# Patient Record
Sex: Male | Born: 1970 | Race: White | Hispanic: No | Marital: Married | Smoking: Never smoker
Health system: Southern US, Community
[De-identification: ages and names within clinical notes are randomized; demographics above are authoritative.]

---

## 2010-08-31 ENCOUNTER — Emergency Department (INDEPENDENT_AMBULATORY_CARE_PROVIDER_SITE_OTHER): Payer: BC Managed Care – PPO

## 2010-08-31 ENCOUNTER — Emergency Department (HOSPITAL_BASED_OUTPATIENT_CLINIC_OR_DEPARTMENT_OTHER)
Admission: EM | Admit: 2010-08-31 | Discharge: 2010-08-31 | Disposition: A | Discharge status: Home / Self Care | Payer: BC Managed Care – PPO | Attending: Emergency Medicine | Admitting: Emergency Medicine

## 2010-08-31 DIAGNOSIS — K7689 Other specified diseases of liver: Secondary | ICD-10-CM

## 2010-08-31 DIAGNOSIS — M5126 Other intervertebral disc displacement, lumbar region: Secondary | ICD-10-CM

## 2010-08-31 DIAGNOSIS — M549 Dorsalgia, unspecified: Secondary | ICD-10-CM | POA: Insufficient documentation

## 2010-08-31 DIAGNOSIS — N201 Calculus of ureter: Secondary | ICD-10-CM

## 2010-08-31 DIAGNOSIS — N200 Calculus of kidney: Secondary | ICD-10-CM | POA: Insufficient documentation

## 2010-08-31 LAB — BASIC METABOLIC PANEL
Calcium: 9.3 mg/dL (ref 8.4–10.5)
Creatinine, Ser: 0.9 mg/dL (ref 0.4–1.5)
GFR calc Af Amer: >60 mL/min (ref >60)
GFR calc non Af Amer: >60 mL/min (ref >60)
Sodium: 146 mEq/L — ABNORMAL HIGH (ref 135–145)

## 2010-08-31 LAB — CBC
HCT: 41.6 % (ref 39.0–52.0)
Hemoglobin: 15.1 g/dL (ref 13.0–17.0)
MCV: 82.2 fL (ref 78.0–100.0)
RBC: 5.06 MIL/uL (ref 4.22–5.81)
WBC: 6.9 10*3/uL (ref 4.0–10.5)

## 2010-08-31 LAB — URINALYSIS, ROUTINE W REFLEX MICROSCOPIC
Specific Gravity, Urine: 1.034 — ABNORMAL HIGH (ref 1.005–1.030)
Urine Glucose, Fasting: NEGATIVE mg/dL
pH: 5.5 (ref 5.0–8.0)

## 2010-08-31 LAB — DIFFERENTIAL
Eosinophils Relative: 2 % (ref 0–5)
Lymphocytes Relative: 28 % (ref 12–46)
Lymphs Abs: 1.9 10*3/uL (ref 0.7–4.0)
Neutro Abs: 4.5 10*3/uL (ref 1.7–7.7)
Neutrophils Relative %: 64 % (ref 43–77)

## 2010-08-31 LAB — URINE MICROSCOPIC-ADD ON

## 2011-05-03 ENCOUNTER — Other Ambulatory Visit (HOSPITAL_COMMUNITY): Payer: Self-pay | Admitting: Neurosurgery

## 2011-05-03 DIAGNOSIS — IMO0002 Reserved for concepts with insufficient information to code with codable children: Secondary | ICD-10-CM

## 2011-05-03 DIAGNOSIS — Q762 Congenital spondylolisthesis: Secondary | ICD-10-CM

## 2011-05-10 ENCOUNTER — Ambulatory Visit (HOSPITAL_COMMUNITY)
Admission: RE | Admit: 2011-05-10 | Discharge: 2011-05-10 | Disposition: A | Discharge status: Home / Self Care | Payer: BC Managed Care – PPO | Source: Ambulatory Visit | Attending: Neurosurgery | Admitting: Neurosurgery

## 2011-05-10 DIAGNOSIS — M5126 Other intervertebral disc displacement, lumbar region: Secondary | ICD-10-CM | POA: Insufficient documentation

## 2011-05-10 DIAGNOSIS — Q762 Congenital spondylolisthesis: Secondary | ICD-10-CM

## 2011-05-10 DIAGNOSIS — IMO0002 Reserved for concepts with insufficient information to code with codable children: Secondary | ICD-10-CM

## 2012-10-30 ENCOUNTER — Emergency Department (HOSPITAL_BASED_OUTPATIENT_CLINIC_OR_DEPARTMENT_OTHER): Payer: BC Managed Care – PPO

## 2012-10-30 ENCOUNTER — Observation Stay (HOSPITAL_BASED_OUTPATIENT_CLINIC_OR_DEPARTMENT_OTHER)
Admission: EM | Admit: 2012-10-30 | Discharge: 2012-10-31 | Disposition: A | Discharge status: Home / Self Care | Payer: BC Managed Care – PPO | Attending: Cardiovascular Disease | Admitting: Cardiovascular Disease

## 2012-10-30 ENCOUNTER — Encounter (HOSPITAL_BASED_OUTPATIENT_CLINIC_OR_DEPARTMENT_OTHER): Payer: Self-pay | Admitting: Emergency Medicine

## 2012-10-30 DIAGNOSIS — E669 Obesity, unspecified: Secondary | ICD-10-CM | POA: Insufficient documentation

## 2012-10-30 DIAGNOSIS — M940 Chondrocostal junction syndrome [Tietze]: Secondary | ICD-10-CM | POA: Insufficient documentation

## 2012-10-30 DIAGNOSIS — R079 Chest pain, unspecified: Principal | ICD-10-CM | POA: Insufficient documentation

## 2012-10-30 DIAGNOSIS — Z6835 Body mass index (BMI) 35.0-35.9, adult: Secondary | ICD-10-CM | POA: Insufficient documentation

## 2012-10-30 DIAGNOSIS — R072 Precordial pain: Secondary | ICD-10-CM

## 2012-10-30 LAB — BASIC METABOLIC PANEL
CO2: 27 mEq/L (ref 19–32)
Calcium: 9.5 mg/dL (ref 8.4–10.5)
Chloride: 104 mEq/L (ref 96–112)
Creatinine, Ser: 0.9 mg/dL (ref 0.50–1.35)
GFR calc Af Amer: >90 mL/min (ref >90)
Sodium: 141 mEq/L (ref 135–145)

## 2012-10-30 LAB — TROPONIN I
Troponin I: <0.3 ng/mL (ref <0.30)
Troponin I: <0.3 ng/mL (ref <0.30)

## 2012-10-30 LAB — MAGNESIUM: Magnesium: 2 mg/dL (ref 1.5–2.5)

## 2012-10-30 LAB — CBC
Hemoglobin: 15.1 g/dL (ref 13.0–17.0)
MCH: 29.9 pg (ref 26.0–34.0)
MCHC: 34.8 g/dL (ref 30.0–36.0)
Platelets: 256 10*3/uL (ref 150–400)
RBC: 5.05 MIL/uL (ref 4.22–5.81)

## 2012-10-30 MED ORDER — ZOLPIDEM TARTRATE 5 MG PO TABS: 5.0000 mg | ORAL_TABLET | Freq: Every evening | ORAL | Status: DC | PRN

## 2012-10-30 MED ORDER — SODIUM CHLORIDE 0.9 % IJ SOLN: 3.0000 mL | Freq: Two times a day (BID) | INTRAMUSCULAR | Status: DC

## 2012-10-30 MED ORDER — SODIUM CHLORIDE 0.9 % IV SOLN
INTRAVENOUS | Status: DC
  Administered 2012-10-30: 14:00:00 via INTRAVENOUS

## 2012-10-30 MED ORDER — GI COCKTAIL ~~LOC~~
30.0000 mL | Freq: Once | ORAL | Status: AC
  Administered 2012-10-30: 30 mL via ORAL
  Filled 2012-10-30: qty 30

## 2012-10-30 MED ORDER — ONDANSETRON HCL 4 MG/2ML IJ SOLN: 4.0000 mg | Freq: Four times a day (QID) | INTRAMUSCULAR | Status: DC | PRN

## 2012-10-30 MED ORDER — ASPIRIN 81 MG PO CHEW
324.0000 mg | CHEWABLE_TABLET | Freq: Once | ORAL | Status: AC
  Administered 2012-10-30: 324 mg via ORAL
  Filled 2012-10-30: qty 4

## 2012-10-30 MED ORDER — SODIUM CHLORIDE 0.9 % IJ SOLN: 3.0000 mL | INTRAMUSCULAR | Status: DC | PRN

## 2012-10-30 MED ORDER — ALPRAZOLAM 0.25 MG PO TABS: 0.2500 mg | ORAL_TABLET | Freq: Two times a day (BID) | ORAL | Status: DC | PRN

## 2012-10-30 MED ORDER — ASPIRIN EC 81 MG PO TBEC
81.0000 mg | DELAYED_RELEASE_TABLET | Freq: Every day | ORAL | Status: DC
  Administered 2012-10-31: 81 mg via ORAL
  Filled 2012-10-30: qty 1

## 2012-10-30 MED ORDER — ACETAMINOPHEN 325 MG PO TABS: 650.0000 mg | ORAL_TABLET | ORAL | Status: DC | PRN

## 2012-10-30 MED ORDER — SODIUM CHLORIDE 0.9 % IV SOLN: 250.0000 mL | INTRAVENOUS | Status: DC | PRN

## 2012-10-30 MED ORDER — NITROGLYCERIN 0.4 MG SL SUBL
0.4000 mg | SUBLINGUAL_TABLET | SUBLINGUAL | Status: DC | PRN
  Filled 2012-10-30: qty 25

## 2012-10-30 NOTE — H&P (Signed)
Cardiology H&P   Patient ID: Charles Liu MRN: [id number], DOB/AGE: [birth year]   Admit date: 10/30/2012 Date of Consult: 10/30/2012  Primary Physician: No primary provider on file. Primary Cardiologist: New  Reason for consult: chest pain  HPI: Charles Liu is a 42 y.o. male with no prior cardiac or medical history. He follows up with a PCP annually. No BP, cholesterol or thyroid issues.   He reports being in his USOH until 6:00 AM this morning when he experiencing left-sided dull chest pressure radiating to his left axilla without associated symptoms rated at a 1-2/10. No aggravation with position changes, deep inspiration or cough. No relation to meals. 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 No fevers, chills, n/v/d, unilateral leg pain, swelling, extended travel, trauma or strenuous activity. The pain persisted and he became concerned. He presented to [facility] ED for further evaluation.   There, EKG revealed NSR without ischemic changes. Trop-I WNL x 2. BMET, CBC unremarkable. VSS. CXR without acute cardiopulmonary process. He was transferred to [facility] for overnight observation and stress testing tomorrow AM.  He is currently stable with minimal chest discomfort.    Problem List: History reviewed. No pertinent past medical history.  History reviewed. No pertinent past surgical history.   Allergies: No Known Allergies  Home Medications: Prior to Admission medications   Not on File    Inpatient Medications:  . sodium chloride   Intravenous STAT   No prescriptions prior to admission    Family History  Problem Relation Age of Onset  . Atrial fibrillation Mother   . Diabetes Father   . Melanoma Father      History   Social History  . Marital Status: Married    Spouse Name: N/A    Number of Children: N/A  . Years of Education: N/A   Occupational History  . Not on file.   Social History Main Topics  . Smoking status:  Never Smoker   . Smokeless tobacco: Not on file  . Alcohol Use: No  . Drug Use: No  . Sexually Active: Not on file   Other Topics Concern  . Not on file   Social History Narrative  . No narrative on file    Review of Systems: General: negative for chills, fever, night sweats or weight changes.  Cardiovascular: positive for chest pain, negative for dyspnea on exertion, edema, orthopnea, palpitations, paroxysmal nocturnal dyspnea or shortness of breath Dermatological: negative for rash Respiratory: negative for cough or wheezing Urologic: negative for hematuria Abdominal: negative for nausea, vomiting, diarrhea, bright red blood per rectum, melena, or hematemesis Neurologic: negative for visual changes, syncope, or dizziness All other systems reviewed and are otherwise negative except as noted above.  Physical Exam: Blood pressure 120/71, pulse 64, temperature 98.2 F (36.8 C), temperature source Oral, resp. rate 18, height 5\' 8"  (1.727 m), weight 104.327 kg (230 lb), SpO2 98.00%.    General: Well developed, well nourished, in no acute distress. Head: Normocephalic, atraumatic, sclera non-icteric, no xanthomas, nares are without discharge.  Neck:  Negative for carotid bruits. JVD not elevated. Lungs: Clear bilaterally to auscultation without wheezes, rales, or rhonchi. Breathing is unlabored. Heart: RRR with S1 S2. No murmurs, rubs, or gallops appreciated. Abdomen: Soft, non-tender, non-distended with normoactive bowel sounds. No hepatomegaly. No rebound/guarding. No obvious abdominal masses. Msk:  Reproducible left-sided chest wall tenderness on palpation. Strength and tone appears normal for age. Extremities:  No clubbing, cyanosis or edema.  Distal pedal pulses are 2+ and equal bilaterally. Neuro: Alert and oriented X 3. Moves all extremities spontaneously. Psych:  Responds to questions appropriately with a normal affect.  Labs: Recent Labs     10/30/12  1000  WBC  5.9  HGB   15.1  HCT  43.4  MCV  85.9  PLT  256    Recent Labs Lab 10/30/12 1000  NA 141  K 4.1  CL 104  CO2 27  BUN 11  CREATININE 0.90  CALCIUM 9.5  GLUCOSE 98   Recent Labs     10/30/12  1000  10/30/12  1230  TROPONINI  <0.30  <0.30    Radiology/Studies: Dg Chest 2 View  10/30/2012  *RADIOLOGY REPORT*  Clinical Data: Left chest pain  CHEST - 2 VIEW  Comparison: None.  Findings: Lungs are clear. No pleural effusion or pneumothorax.  Cardiomediastinal silhouette is within normal limits.  Visualized osseous structures are within normal limits.  IMPRESSION: No evidence of acute cardiopulmonary disease.   Original Report Authenticated By: Charline Bills, M.D.    EKG: NSR, 67 bpm, no ST/T changes  ASSESSMENT AND PLAN:   42 y.o. male with no prior cardiac or medical history. He follows up with a PCP annually. No BP, cholesterol or thyroid issues.   1. Precordial pain  The patient has been transfered from [facility] ED for overnight rule out. Taking subjective and objective information into account, suspect chest discomfort represents more of a chest wall/inflammatory musculoskeletal etiology. He has no known cardiac risk factors, and at present would be considered at a low pretest probability. Discontinue IVF (continued on transfer). Obtain additional trop-I. Will plan ETT tomorrow. Make NPO at midnight. Will risk stratify- Hgb A1C, lipid panel, TSH in the meantime.   Signed, R. Hurman Horn, PA-C 10/30/2012, 5:03 PM  Patient seen, examined. Available data reviewed. Agree with findings, assessment, and plan as outlined by Hurman Horn, PA-C. The patient was independently interviewed and examined. He is an age-appropriate 42 year old male. He's in no acute distress. Lungs are clear. Heart is regular rate and rhythm. There is tenderness over the left chest wall to palpation. There is no edema. EKG shows normal sinus rhythm and is within normal limits. Initial lab test is unrevealing including a  normal troponin. I agree that an exercise treadmill test is appropriate to rule out ischemic heart disease. Overall he appears low risk and further stratification with a hemoglobin A1c, lipid panel are appropriate. He is a good candidate for a non-imaging treadmill study because of his young age and normal EKG. If his treadmill study is negative, I would suspect costochondritis and recommend a short course of nonsteroidal anti-inflammatory drugs.  Tonny Bollman, M.D. 10/30/2012 5:26 PM

## 2012-10-30 NOTE — ED Notes (Signed)
Gradual onset of left sided CP after awakening around 0630 this am.  Pain radiates under left axilla and to his back.  Also reports some "mild tingling in left hand" and h/a.  Denies SHOB, n/v or diaphoresis.

## 2012-10-30 NOTE — ED Notes (Signed)
Patient transported to X-ray 

## 2012-10-30 NOTE — Consult Note (Deleted)
Cardiology Consult Note   Patient ID: Charles Liu MRN: [id number], DOB/AGE: 1970-12-13   Admit date: 10/30/2012 Date of Consult: 10/30/2012  Primary Physician: No primary provider on file. Primary Cardiologist: New  Reason for consult: chest pain  HPI: Charles Liu is a 42 y.o. male with no prior cardiac or medical history. He follows up with a PCP annually. No BP, cholesterol or thyroid issues.   He reports being in his USOH until 6:00 AM this morning when he experiencing left-sided dull chest pressure radiating to his left axilla without associated symptoms rated at a 1-2/10. No aggravation with position changes, deep inspiration or cough. No relation to meals. 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 No fevers, chills, n/v/d, unilateral leg pain, swelling, extended travel, trauma or strenuous activity. The pain persisted and he became concerned. He presented to [facility] ED for further evaluation.   There, EKG revealed NSR without ischemic changes. Trop-I WNL x 2. BMET, CBC unremarkable. VSS. CXR without acute cardiopulmonary process. He was transferred to [facility] for overnight observation and stress testing tomorrow AM.  He is currently stable with minimal chest discomfort.    Problem List: History reviewed. No pertinent past medical history.  History reviewed. No pertinent past surgical history.   Allergies: No Known Allergies  Home Medications: Prior to Admission medications   Not on File    Inpatient Medications:  . sodium chloride   Intravenous STAT   No prescriptions prior to admission    Family History  Problem Relation Age of Onset  . Atrial fibrillation Mother   . Diabetes Father   . Melanoma Father      History   Social History  . Marital Status: Married    Spouse Name: N/A    Number of Children: N/A  . Years of Education: N/A   Occupational History  . Not on file.   Social History Main Topics  . Smoking  status: Never Smoker   . Smokeless tobacco: Not on file  . Alcohol Use: No  . Drug Use: No  . Sexually Active: Not on file   Other Topics Concern  . Not on file   Social History Narrative  . No narrative on file    Review of Systems: General: negative for chills, fever, night sweats or weight changes.  Cardiovascular: positive for chest pain, negative for dyspnea on exertion, edema, orthopnea, palpitations, paroxysmal nocturnal dyspnea or shortness of breath Dermatological: negative for rash Respiratory: negative for cough or wheezing Urologic: negative for hematuria Abdominal: negative for nausea, vomiting, diarrhea, bright red blood per rectum, melena, or hematemesis Neurologic: negative for visual changes, syncope, or dizziness All other systems reviewed and are otherwise negative except as noted above.  Physical Exam: Blood pressure 120/71, pulse 64, temperature 98.2 F (36.8 C), temperature source Oral, resp. rate 18, height 5\' 8"  (1.727 m), weight 104.327 kg (230 lb), SpO2 98.00%.    General: Well developed, well nourished, in no acute distress. Head: Normocephalic, atraumatic, sclera non-icteric, no xanthomas, nares are without discharge.  Neck:  Negative for carotid bruits. JVD not elevated. Lungs: Clear bilaterally to auscultation without wheezes, rales, or rhonchi. Breathing is unlabored. Heart: RRR with S1 S2. No murmurs, rubs, or gallops appreciated. Abdomen: Soft, non-tender, non-distended with normoactive bowel sounds. No hepatomegaly. No rebound/guarding. No obvious abdominal masses. Msk:  Reproducible left-sided chest wall tenderness on palpation. Strength and tone appears normal for age. Extremities:  No clubbing, cyanosis or  edema.  Distal pedal pulses are 2+ and equal bilaterally. Neuro: Alert and oriented X 3. Moves all extremities spontaneously. Psych:  Responds to questions appropriately with a normal affect.  Labs: Recent Labs     10/30/12  1000  WBC   5.9  HGB  15.1  HCT  43.4  MCV  85.9  PLT  256    Recent Labs Lab 10/30/12 1000  NA 141  K 4.1  CL 104  CO2 27  BUN 11  CREATININE 0.90  CALCIUM 9.5  GLUCOSE 98   Recent Labs     10/30/12  1000  10/30/12  1230  TROPONINI  <0.30  <0.30    Radiology/Studies: Dg Chest 2 View  10/30/2012  *RADIOLOGY REPORT*  Clinical Data: Left chest pain  CHEST - 2 VIEW  Comparison: None.  Findings: Lungs are clear. No pleural effusion or pneumothorax.  Cardiomediastinal silhouette is within normal limits.  Visualized osseous structures are within normal limits.  IMPRESSION: No evidence of acute cardiopulmonary disease.   Original Report Authenticated By: Charline Bills, M.D.    EKG: NSR, 67 bpm, no ST/T changes  ASSESSMENT AND PLAN:   42 y.o. male with no prior cardiac or medical history. He follows up with a PCP annually. No BP, cholesterol or thyroid issues.   1. Precordial pain  The patient has been transfered from [facility] ED for overnight rule out. Taking subjective and objective information into account, suspect chest discomfort represents more of a chest wall/inflammatory musculoskeletal etiology. He has no known cardiac risk factors, and at present would be considered at a low pretest probability. Discontinue IVF (continued on transfer). Obtain additional trop-I. Will plan ETT tomorrow. Make NPO at midnight. Will risk stratify- Hgb A1C, lipid panel, TSH in the meantime.     Signed, R. Hurman Horn, PA-C 10/30/2012, 4:49 PM

## 2012-10-30 NOTE — ED Notes (Signed)
Started on O2 2 liters via nasal cannula as per ordered.

## 2012-10-30 NOTE — ED Provider Notes (Signed)
History     CSN: [id number]  Arrival date & time 10/30/12  4098   First MD Initiated Contact with Patient 10/30/12 1047      Chief Complaint  Patient presents with  . Chest Pain    (Consider location/radiation/quality/duration/timing/severity/associated sxs/prior treatment) HPI Comments: Patient is a 42 year old man who complains of chest pain and pressure. This started around 6 to 6:30 this morning. The pain is felt in the left upper anterior chest. He took no medication for this. He denies any prior similar episode.  Patient is a 42 y.o. male presenting with chest pain. The history is provided by the patient and the spouse. A language interpreter was used.  Chest Pain Pain location:  L chest Pain quality: pressure and tightness   Pain radiates to:  Does not radiate Pain radiates to the back: no   Pain severity:  Moderate Timing:  Constant Progression:  Worsening Chronicity:  New Relieved by:  Nothing Worsened by:  Nothing tried Ineffective treatments:  None tried Associated symptoms comment:  No significant illnesses in the past. Family history is notable in that his mother has atrial fibrillation, his father has diabetes and melanoma. Risk factors: male sex   Risk factors: no smoking     History reviewed. No pertinent past medical history.  History reviewed. No pertinent past surgical history.  No family history on file.  History  Substance Use Topics  . Smoking status: Never Smoker   . Smokeless tobacco: Not on file  . Alcohol Use: No      Review of Systems  Constitutional: Negative for chills.  HENT: Negative.   Eyes: Negative.   Respiratory: Negative.   Cardiovascular: Positive for chest pain.  Gastrointestinal: Negative.   Genitourinary: Negative.   Musculoskeletal: Negative.   Neurological: Negative.   Psychiatric/Behavioral: Negative.     Allergies  Review of patient's allergies indicates no known allergies.  Home Medications  No current  outpatient prescriptions on file.  BP 112/76  Pulse 72  Temp(Src) 97.9 F (36.6 C) (Oral)  Ht 5\' 8"  (1.727 m)  Wt 230 lb (104.327 kg)  BMI 34.98 kg/m2  SpO2 97%  Physical Exam  Nursing note and vitals reviewed. Constitutional: He is oriented to person, place, and time. He appears well-developed and well-nourished. No distress.  HENT:  Head: Normocephalic and atraumatic.  Right Ear: External ear normal.  Left Ear: External ear normal.  Mouth/Throat: Oropharynx is clear and moist.  Eyes: Conjunctivae and EOM are normal. Pupils are equal, round, and reactive to light.  Neck: Normal range of motion. Neck supple.  Cardiovascular: Normal rate, regular rhythm and normal heart sounds.   Pulmonary/Chest: Effort normal and breath sounds normal. He exhibits no tenderness.  Abdominal: Soft. Bowel sounds are normal.  Musculoskeletal: Normal range of motion. He exhibits no edema and no tenderness.  Neurological: He is alert and oriented to person, place, and time.  No sensory or motor deficit.  Skin: Skin is warm and dry.  Psychiatric: He has a normal mood and affect. His behavior is normal.    ED Course  Procedures (including critical care time)   Date: 10/30/2012  Rate: 67  Rhythm: normal sinus rhythm  QRS Axis: normal  Intervals: normal  ST/T Wave abnormalities: normal  Conduction Disutrbances:none  Narrative Interpretation: Normal EKG  Old EKG Reviewed: none available    Results for orders placed during the hospital encounter of 10/30/12  CBC      Result Value Range   WBC 5.9  4.0 - 10.5 K/uL   RBC 5.05  4.22 - 5.81 MIL/uL   Hemoglobin 15.1  13.0 - 17.0 g/dL   HCT 40.9  81.1 - 91.4 %   MCV 85.9  78.0 - 100.0 fL   MCH 29.9  26.0 - 34.0 pg   MCHC 34.8  30.0 - 36.0 g/dL   RDW 78.2  95.6 - 21.3 %   Platelets 256  150 - 400 K/uL  TROPONIN I      Result Value Range   Troponin I <0.30  <0.30 ng/mL  BASIC METABOLIC PANEL      Result Value Range   Sodium 141  135 - 145  mEq/L   Potassium 4.1  3.5 - 5.1 mEq/L   Chloride 104  96 - 112 mEq/L   CO2 27  19 - 32 mEq/L   Glucose, Bld 98  70 - 99 mg/dL   BUN 11  6 - 23 mg/dL   Creatinine, Ser 0.86  0.50 - 1.35 mg/dL   Calcium 9.5  8.4 - 57.8 mg/dL   GFR calc non Af Amer >90  >90 mL/min   GFR calc Af Amer >90  >90 mL/min  TROPONIN I      Result Value Range   Troponin I <0.30  <0.30 ng/mL   Dg Chest 2 View  10/30/2012  *RADIOLOGY REPORT*  Clinical Data: Left chest pain  CHEST - 2 VIEW  Comparison: None.  Findings: Lungs are clear. No pleural effusion or pneumothorax.  Cardiomediastinal silhouette is within normal limits.  Visualized osseous structures are within normal limits.  IMPRESSION: No evidence of acute cardiopulmonary disease.   Original Report Authenticated By: Charline Bills, M.D.    1:49 PM Course in ED: pt was seen and had physical exam.  Lab workup showed normal EKG, normal labs including 2 sets of troponins.  Case discussed with Tonny Bollman, M.D., cardiologist, who accepts pt in transfer for observation at [facility] [address] [place].  Plan is for pt to have stress test tomorrow morning.    1. Chest pain        Carleene Cooper III, MD 10/30/12 [phone]

## 2012-10-31 DIAGNOSIS — R079 Chest pain, unspecified: Secondary | ICD-10-CM

## 2012-10-31 LAB — CBC
HCT: 42.7 % (ref 39.0–52.0)
Hemoglobin: 14.9 g/dL (ref 13.0–17.0)
MCH: 29.8 pg (ref 26.0–34.0)
MCHC: 34.9 g/dL (ref 30.0–36.0)
RBC: 5 MIL/uL (ref 4.22–5.81)

## 2012-10-31 LAB — BASIC METABOLIC PANEL
BUN: 12 mg/dL (ref 6–23)
CO2: 27 mEq/L (ref 19–32)
GFR calc non Af Amer: >90 mL/min (ref >90)
Glucose, Bld: 101 mg/dL — ABNORMAL HIGH (ref 70–99)
Potassium: 4 mEq/L (ref 3.5–5.1)
Sodium: 138 mEq/L (ref 135–145)

## 2012-10-31 LAB — LIPID PANEL
HDL: 31 mg/dL — ABNORMAL LOW (ref >39)
LDL Cholesterol: 105 mg/dL — ABNORMAL HIGH (ref 0–99)
Triglycerides: 184 mg/dL — ABNORMAL HIGH (ref <150)
VLDL: 37 mg/dL (ref 0–40)

## 2012-10-31 MED ORDER — NAPROXEN SODIUM 220 MG PO TABS: 220.0000 mg | ORAL_TABLET | Freq: Every day | ORAL | Status: DC | PRN

## 2012-10-31 MED ORDER — NAPROXEN SODIUM 220 MG PO TABS: 220.0000 mg | ORAL_TABLET | Freq: Two times a day (BID) | ORAL | Status: AC | PRN

## 2012-10-31 NOTE — Progress Notes (Signed)
Patient Name: Charles Liu Date of Encounter: 10/31/2012  Active Problems:   Precordial pain    SUBJECTIVE: No overnight problems and no complaints at this time.  States the pain is still there but is a 1-2/10 (pressure) which is less than yesterdays 2-3/10.  Today his pain is not radiating to his axilla and palpitation did not worsen the pain.  Waiting in room with his wife for his exercise stress test.  OBJECTIVE Filed Vitals:   10/30/12 1315 10/30/12 1556 10/30/12 2100 10/31/12 0700  BP: 122/70 120/71 99/63 122/75  Pulse: 72 64 71 90  Temp: 98.2 F (36.8 C) 98.2 F (36.8 C) 97.6 F (36.4 C) 98 F (36.7 C)  TempSrc: Oral Oral Oral Oral  Resp: 18 18 20 20   Height:      Weight:      SpO2: 100% 98% 96% 96%    Intake/Output Summary (Last 24 hours) at 10/31/12 1155 Last data filed at 10/31/12 0900  Gross per 24 hour  Intake      0 ml  Output      0 ml  Net      0 ml   Filed Weights   10/30/12 0948  Weight: 230 lb (104.327 kg)    PHYSICAL EXAM General: Well developed, well nourished, male in no acute distress. Head: Normocephalic, atraumatic.  Neck: Supple without bruits, JVD not elevated. Lungs:  Resp regular and unlabored, CTA. Heart: RRR, S1, S2, no S3, S4, or murmur; no rub. Abdomen: Soft, non-tender, non-distended, BS + x 4.  Extremities: No clubbing, cyanosis, no edema.  Neuro: Alert and oriented X 3. Moves all extremities spontaneously. Psych: Normal affect.  LABS: CBC: Recent Labs  10/30/12 1000 10/31/12 0445  WBC 5.9 6.6  HGB 15.1 14.9  HCT 43.4 42.7  MCV 85.9 85.4  PLT 256 233   Basic Metabolic Panel: Recent Labs  10/30/12 1000 10/30/12 1743 10/31/12 0445  NA 141  --  138  K 4.1  --  4.0  CL 104  --  104  CO2 27  --  27  GLUCOSE 98  --  101*  BUN 11  --  12  CREATININE 0.90  --  0.96  CALCIUM 9.5  --  8.7  MG  --  2.0  --    Cardiac Enzymes: Recent Labs  10/30/12 1000 10/30/12 1230 10/30/12 1741  TROPONINI <0.30 <0.30 <0.30    Hemoglobin A1C: Recent Labs  10/30/12 1743  HGBA1C 5.5   Fasting Lipid Panel: Recent Labs  10/31/12 0445  CHOL 173  HDL 31*  LDLCALC 105*  TRIG 184*  CHOLHDL 5.6   Thyroid Function Tests: Recent Labs  10/30/12 1743  TSH 1.927   TELE:   SR     Radiology/Studies: Dg Chest 2 View 10/30/2012  *RADIOLOGY REPORT*  Clinical Data: Left chest pain  CHEST - 2 VIEW  Comparison: None.  Findings: Lungs are clear. No pleural effusion or pneumothorax.  Cardiomediastinal silhouette is within normal limits.  Visualized osseous structures are within normal limits.  IMPRESSION: No evidence of acute cardiopulmonary disease.   Original Report Authenticated By: Charline Bills, M.D.     Current Medications:  . aspirin EC  81 mg Oral Daily  . sodium chloride  3 mL Intravenous Q12H      ASSESSMENT AND PLAN: Active Problems:   Precordial pain - Stress test today, start scheduled NSAIDs for possible inflammatory cause of chest pain. Reviewed test results so far with pt/wife. Possible d/c  this pm if GXT is OK.  Borderline hyperlipidemia - encourage heart-healthy diet.  Obesity - encourage heart-healthy diet.  Signed, Brad Henningsgaard, PA-S  Seen and agree, changes made. Theodore Demark , PA-C 11:55 AM 10/31/2012

## 2012-10-31 NOTE — Discharge Summary (Signed)
Discharge Summary   Patient ID: Charles Liu MRN: [id number], DOB/AGE: 40/40/1972 40 y.o. Admit date: 10/30/2012 D/C date:     10/31/2012  Primary Cardiologist: Seen by Excell Seltzer this admission  Primary Discharge Diagnoses:  1. Costochondritis 2. Obesity BMI 35  Hospital Course: Charles Liu is a 42 y/o M M with no prior cardiac or medical history who follows with a PCP annually. He was in his usual state of health yesterday when he began experiencing left-sided dull chest pressure radiating to his left axilla without associated symptoms rated at a 1-2/10. There was no aggravation with position changes, deep inspiration or cough and no relation to meals. He denies any exertional symptoms. No fevers, chills, n/v/d, unilateral leg pain, swelling, extended travel, trauma or strenuous activity. Because pain persisted, he presented to the [facility] ER where EKG revealed NSR without ischemic changes. Trop-I WNL. CBC and BMET were unremarkable and CXR was nonacute. He was transferred to [facility] for cardiology evaluation and observation. His troponins remained negative. He underwent ETT this afternoon (11:21 on the Bruce protocol) that was normal per read by Dr. Jens Som. LDL was 105; heart healthy diet was encouraged. Dr. Excell Seltzer has seen and examined Charles Liu and feels he is stable for discharge. His chest pain was felt due to costochondritis and he was instructed that he may try Aleve for his pain.  Discharge Vitals: Blood pressure 122/75, pulse 90, temperature 98 F (36.7 C), temperature source Oral, resp. rate 20, height 5\' 8"  (1.727 m), weight 230 lb (104.327 kg), SpO2 96.00%.  Labs: Lab Results  Component Value Date   WBC 6.6 10/31/2012   HGB 14.9 10/31/2012   HCT 42.7 10/31/2012   MCV 85.4 10/31/2012   PLT 233 10/31/2012     Recent Labs Lab 10/31/12 0445  NA 138  K 4.0  CL 104  CO2 27  BUN 12  CREATININE 0.96  CALCIUM 8.7  GLUCOSE 101*    Recent Labs  10/30/12 1000 10/30/12 1230  10/30/12 1741  TROPONINI <0.30 <0.30 <0.30   Lab Results  Component Value Date   CHOL 173 10/31/2012   HDL 31* 10/31/2012   LDLCALC 105* 10/31/2012   TRIG 184* 10/31/2012    Diagnostic Studies/Procedures   Dg Chest 2 View 10/30/2012  *RADIOLOGY REPORT*  Clinical Data: Left chest pain  CHEST - 2 VIEW  Comparison: None.  Findings: Lungs are clear. No pleural effusion or pneumothorax.  Cardiomediastinal silhouette is within normal limits.  Visualized osseous structures are within normal limits.  IMPRESSION: No evidence of acute cardiopulmonary disease.   Original Report Authenticated By: Charline Bills, M.D.     Discharge Medications     Medication List    TAKE these medications       naproxen sodium 220 MG tablet  Commonly known as:  ALEVE  Take 1-2 tablets (220-440 mg total) by mouth 2 (two) times daily as needed (For pain. Take with food.).        Disposition   The patient will be discharged in stable condition to home. Discharge Orders   Future Orders Complete By Expires     Diet - low sodium heart healthy  As directed     Increase activity slowly  As directed     Comments:      You may return to work.      Follow-up Information   Follow up with Primary Care Doctor. (As scheduled)         Duration of Discharge  Encounter: Greater than 30 minutes including physician and PA time.  Signed, Dayna Dunn PA-C 10/31/2012, 2:27 PM  Pt with normal exercise treadmill study. He was seen by Ronie Spies, PA-C. I was not able to see Charles Liu before discharge. He should follow-up with his PCP. Otherwise see details from note yesterday - suspect costochondritis as etiology of chest pain.  Tonny Bollman 10/31/2012 4:19 PM

## 2012-11-03 ENCOUNTER — Telehealth: Payer: Self-pay

## 2012-11-03 NOTE — Telephone Encounter (Signed)
**Note De-Identified Shamecka Hocutt Obfuscation** TCM call:   Pt states that he is "doing fine" since his hospital d/c and that he has all of his medications. Pt was advised at hospital d/c to f/u with his PCP which pt states he has scheduled an appointment with that office and he does not require a eph f/u with this office. Pt given this office phone number to call if he has any questions or concerns, he verbalized understanding.

## 2012-11-03 NOTE — Telephone Encounter (Signed)
**Note De-identified Ranette Luckadoo Obfuscation** TCM call:  LMTCB. 

## 2015-05-24 ENCOUNTER — Other Ambulatory Visit: Payer: Self-pay | Admitting: Family Medicine

## 2015-05-24 ENCOUNTER — Ambulatory Visit
Admission: RE | Admit: 2015-05-24 | Discharge: 2015-05-24 | Disposition: A | Discharge status: Home / Self Care | Payer: BC Managed Care – PPO | Source: Ambulatory Visit | Attending: Family Medicine | Admitting: Family Medicine

## 2015-05-24 DIAGNOSIS — M7989 Other specified soft tissue disorders: Principal | ICD-10-CM

## 2015-05-24 DIAGNOSIS — M79661 Pain in right lower leg: Secondary | ICD-10-CM

## 2019-04-28 ENCOUNTER — Encounter (HOSPITAL_BASED_OUTPATIENT_CLINIC_OR_DEPARTMENT_OTHER): Payer: Self-pay | Admitting: Emergency Medicine

## 2019-04-28 ENCOUNTER — Emergency Department (HOSPITAL_BASED_OUTPATIENT_CLINIC_OR_DEPARTMENT_OTHER): Payer: No Typology Code available for payment source

## 2019-04-28 ENCOUNTER — Other Ambulatory Visit: Payer: Self-pay

## 2019-04-28 ENCOUNTER — Emergency Department (HOSPITAL_BASED_OUTPATIENT_CLINIC_OR_DEPARTMENT_OTHER)
Admission: EM | Admit: 2019-04-28 | Discharge: 2019-04-28 | Disposition: A | Discharge status: Home / Self Care | Payer: No Typology Code available for payment source | Attending: Emergency Medicine | Admitting: Emergency Medicine

## 2019-04-28 DIAGNOSIS — S0083XA Contusion of other part of head, initial encounter: Secondary | ICD-10-CM | POA: Diagnosis not present

## 2019-04-28 DIAGNOSIS — Z23 Encounter for immunization: Secondary | ICD-10-CM | POA: Diagnosis not present

## 2019-04-28 DIAGNOSIS — W2111XA Struck by baseball bat, initial encounter: Secondary | ICD-10-CM | POA: Insufficient documentation

## 2019-04-28 DIAGNOSIS — Y92320 Baseball field as the place of occurrence of the external cause: Secondary | ICD-10-CM | POA: Diagnosis not present

## 2019-04-28 DIAGNOSIS — S060X1A Concussion with loss of consciousness of 30 minutes or less, initial encounter: Secondary | ICD-10-CM | POA: Insufficient documentation

## 2019-04-28 DIAGNOSIS — Y998 Other external cause status: Secondary | ICD-10-CM | POA: Diagnosis not present

## 2019-04-28 DIAGNOSIS — Y9364 Activity, baseball: Secondary | ICD-10-CM | POA: Insufficient documentation

## 2019-04-28 DIAGNOSIS — S0990XA Unspecified injury of head, initial encounter: Secondary | ICD-10-CM | POA: Diagnosis present

## 2019-04-28 LAB — CBG MONITORING, ED: Glucose-Capillary: 96 mg/dL (ref 70–99)

## 2019-04-28 MED ORDER — BACITRACIN ZINC 500 UNIT/GM EX OINT
1.0000 | TOPICAL_OINTMENT | Freq: Two times a day (BID) | CUTANEOUS | Status: DC
Start: 2019-04-28 — End: 2019-04-28
  Filled 2019-04-28: qty 28.35

## 2019-04-28 MED ORDER — TETANUS-DIPHTH-ACELL PERTUSSIS 5-2.5-18.5 LF-MCG/0.5 IM SUSP
0.5000 mL | Freq: Once | INTRAMUSCULAR | Status: AC
  Administered 2019-04-28: 17:00:00 0.5 mL via INTRAMUSCULAR
  Filled 2019-04-28: qty 0.5

## 2019-04-28 NOTE — ED Notes (Signed)
Pt on monitor 
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
## 2019-06-02 ENCOUNTER — Telehealth: Payer: Self-pay | Admitting: Plastic Surgery

## 2019-06-02 NOTE — Progress Notes (Addendum)
06/02/2019 344 pm. TOC CM received from EDP, Dr Rex Kras for referral to Dr. Marla Roe office, [place] office for new referral. Will have MD review and office will reach out to pt and schedule appt. Attempted call to pt and left HIPAA compliant voice message.  [place], [place] ED TOC CM [phone]
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
## 2019-10-29 ENCOUNTER — Ambulatory Visit: Payer: BC Managed Care – PPO

## 2019-11-27 ENCOUNTER — Ambulatory Visit: Payer: BC Managed Care – PPO | Attending: Internal Medicine

## 2019-11-27 DIAGNOSIS — Z23 Encounter for immunization: Secondary | ICD-10-CM

## 2019-11-27 NOTE — Progress Notes (Signed)
   Covid-19 Vaccination Clinic  Name:  Charles Liu    MRN: [id number] DOB: 1971/05/19  11/27/2019  Charles Liu was observed post Covid-19 immunization for 15 minutes without incident. He was provided with Vaccine Information Sheet and instruction to access the V-Safe system.   Charles Liu was instructed to call 911 with any severe reactions post vaccine: Marland Kitchen Difficulty breathing  . Swelling of face and throat  . A fast heartbeat  . A bad rash all over body  . Dizziness and weakness   Immunizations Administered    Name Date Dose VIS Date Route   Pfizer COVID-19 Vaccine 11/27/2019  8:45 AM 0.3 mL 09/23/2018 Intramuscular   Manufacturer: [employer], [employer]   Lot: [id number]   NDC: [address]-0

## 2019-12-21 ENCOUNTER — Ambulatory Visit: Payer: BC Managed Care – PPO | Attending: Internal Medicine

## 2019-12-21 DIAGNOSIS — Z23 Encounter for immunization: Secondary | ICD-10-CM

## 2019-12-21 NOTE — Progress Notes (Signed)
   Covid-19 Vaccination Clinic  Name:  Madyx Delfin    MRN: [id number] DOB: 12-10-1970  12/21/2019  Mr. Starks was observed post Covid-19 immunization for 15 minutes without incident. He was provided with Vaccine Information Sheet and instruction to access the V-Safe system.   Mr. Bedwell was instructed to call 911 with any severe reactions post vaccine: Marland Kitchen Difficulty breathing  . Swelling of face and throat  . A fast heartbeat  . A bad rash all over body  . Dizziness and weakness   Immunizations Administered    Name Date Dose VIS Date Route   Pfizer COVID-19 Vaccine 12/21/2019  8:31 AM 0.3 mL 09/23/2018 Intramuscular   Manufacturer: [employer], [employer]   Lot: [id number]   NDC: [address]-7

## 2020-07-27 ENCOUNTER — Ambulatory Visit
Admission: RE | Admit: 2020-07-27 | Discharge: 2020-07-27 | Disposition: A | Discharge status: Home / Self Care | Payer: BC Managed Care – PPO | Source: Ambulatory Visit | Attending: Family Medicine | Admitting: Family Medicine

## 2020-07-27 ENCOUNTER — Other Ambulatory Visit: Payer: Self-pay

## 2020-07-27 ENCOUNTER — Other Ambulatory Visit: Payer: Self-pay | Admitting: Family Medicine

## 2020-07-27 DIAGNOSIS — R059 Cough, unspecified: Secondary | ICD-10-CM

## 2022-03-12 NOTE — Telephone Encounter (Signed)
error 

## 2022-03-22 IMAGING — CR DG CHEST 2V
2 series · 2 of 2 positions shown · non-contrast
Comparison: 10/30/2012

CLINICAL DATA: Cough for 3 months

EXAM:
CHEST - 2 VIEW

[w chest pa]
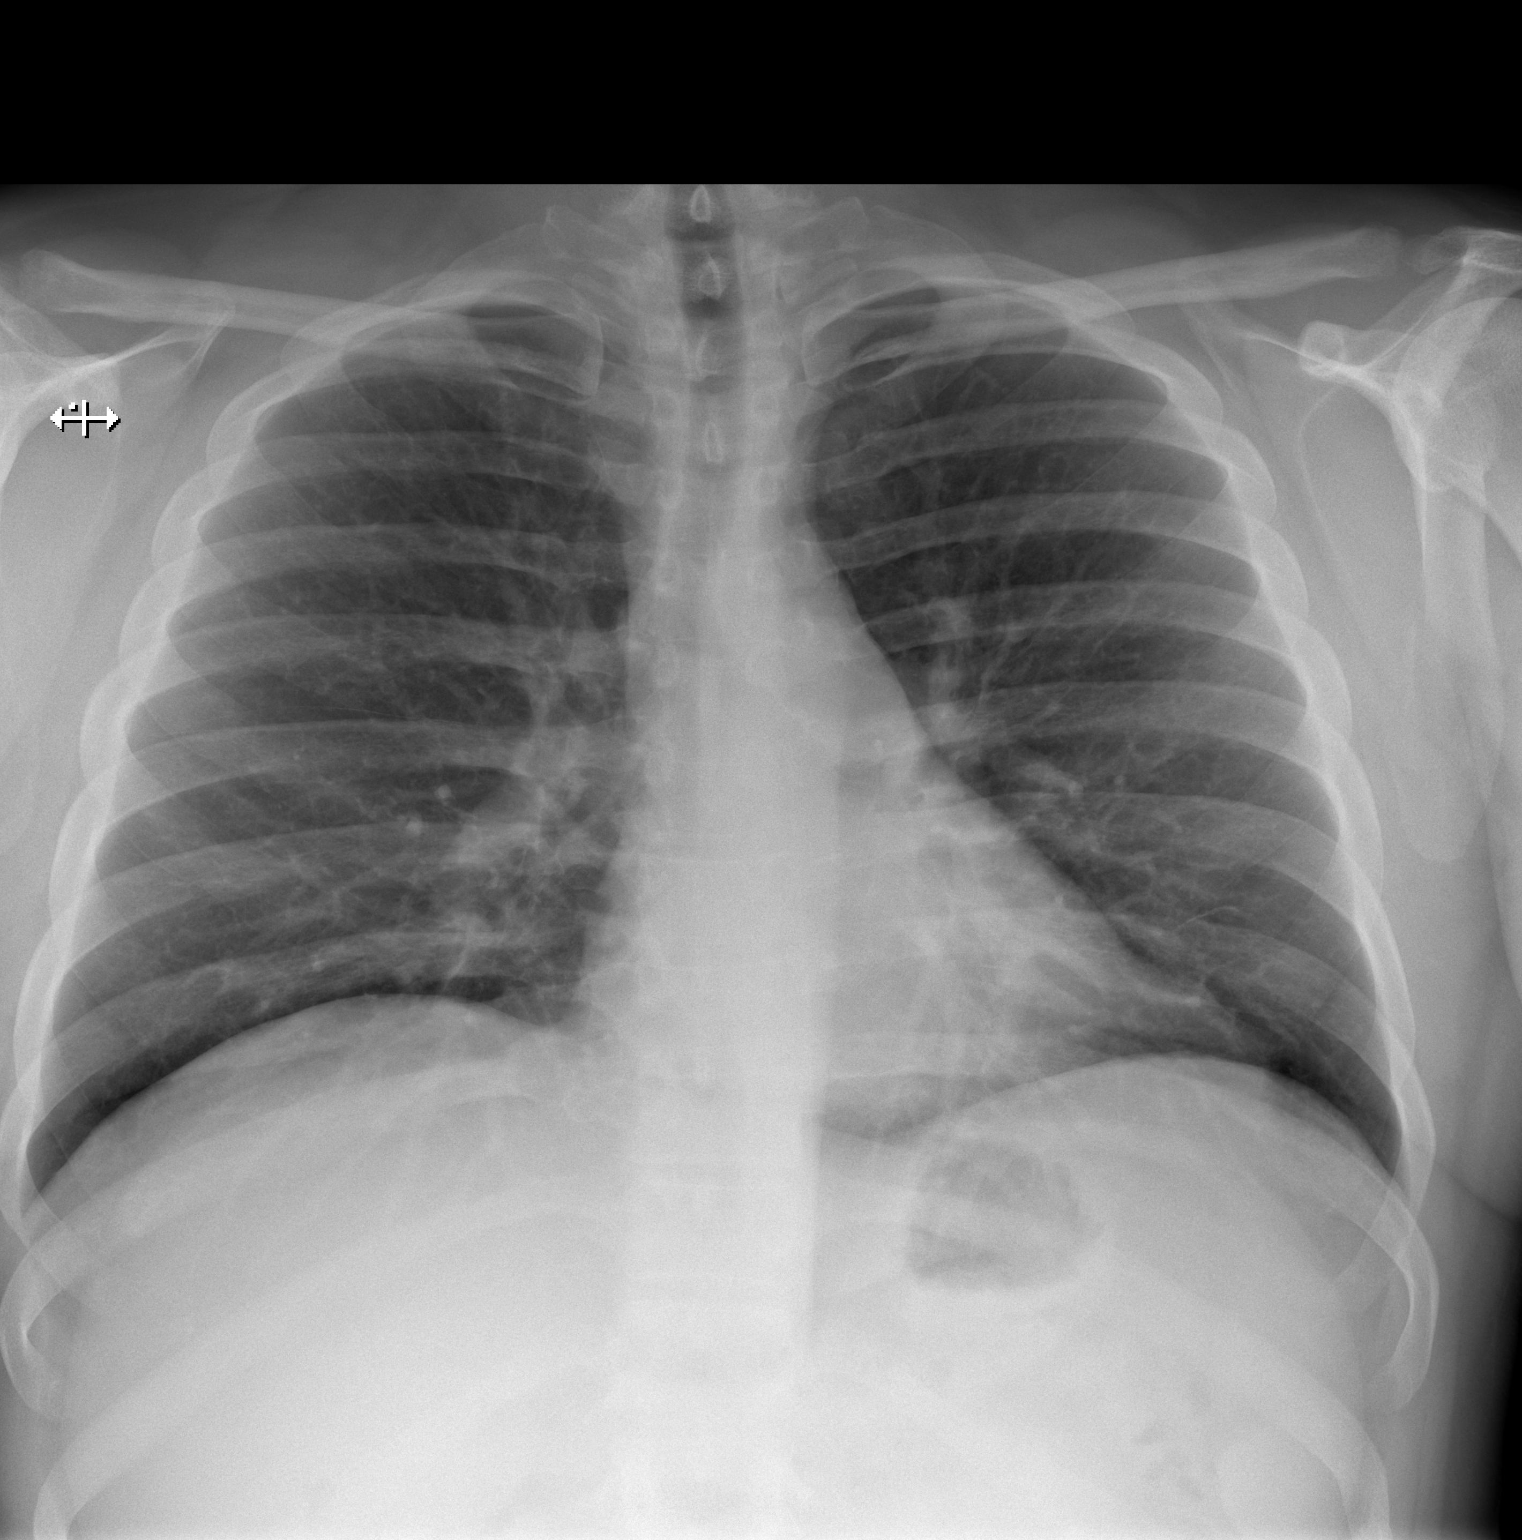

[w chest lat]
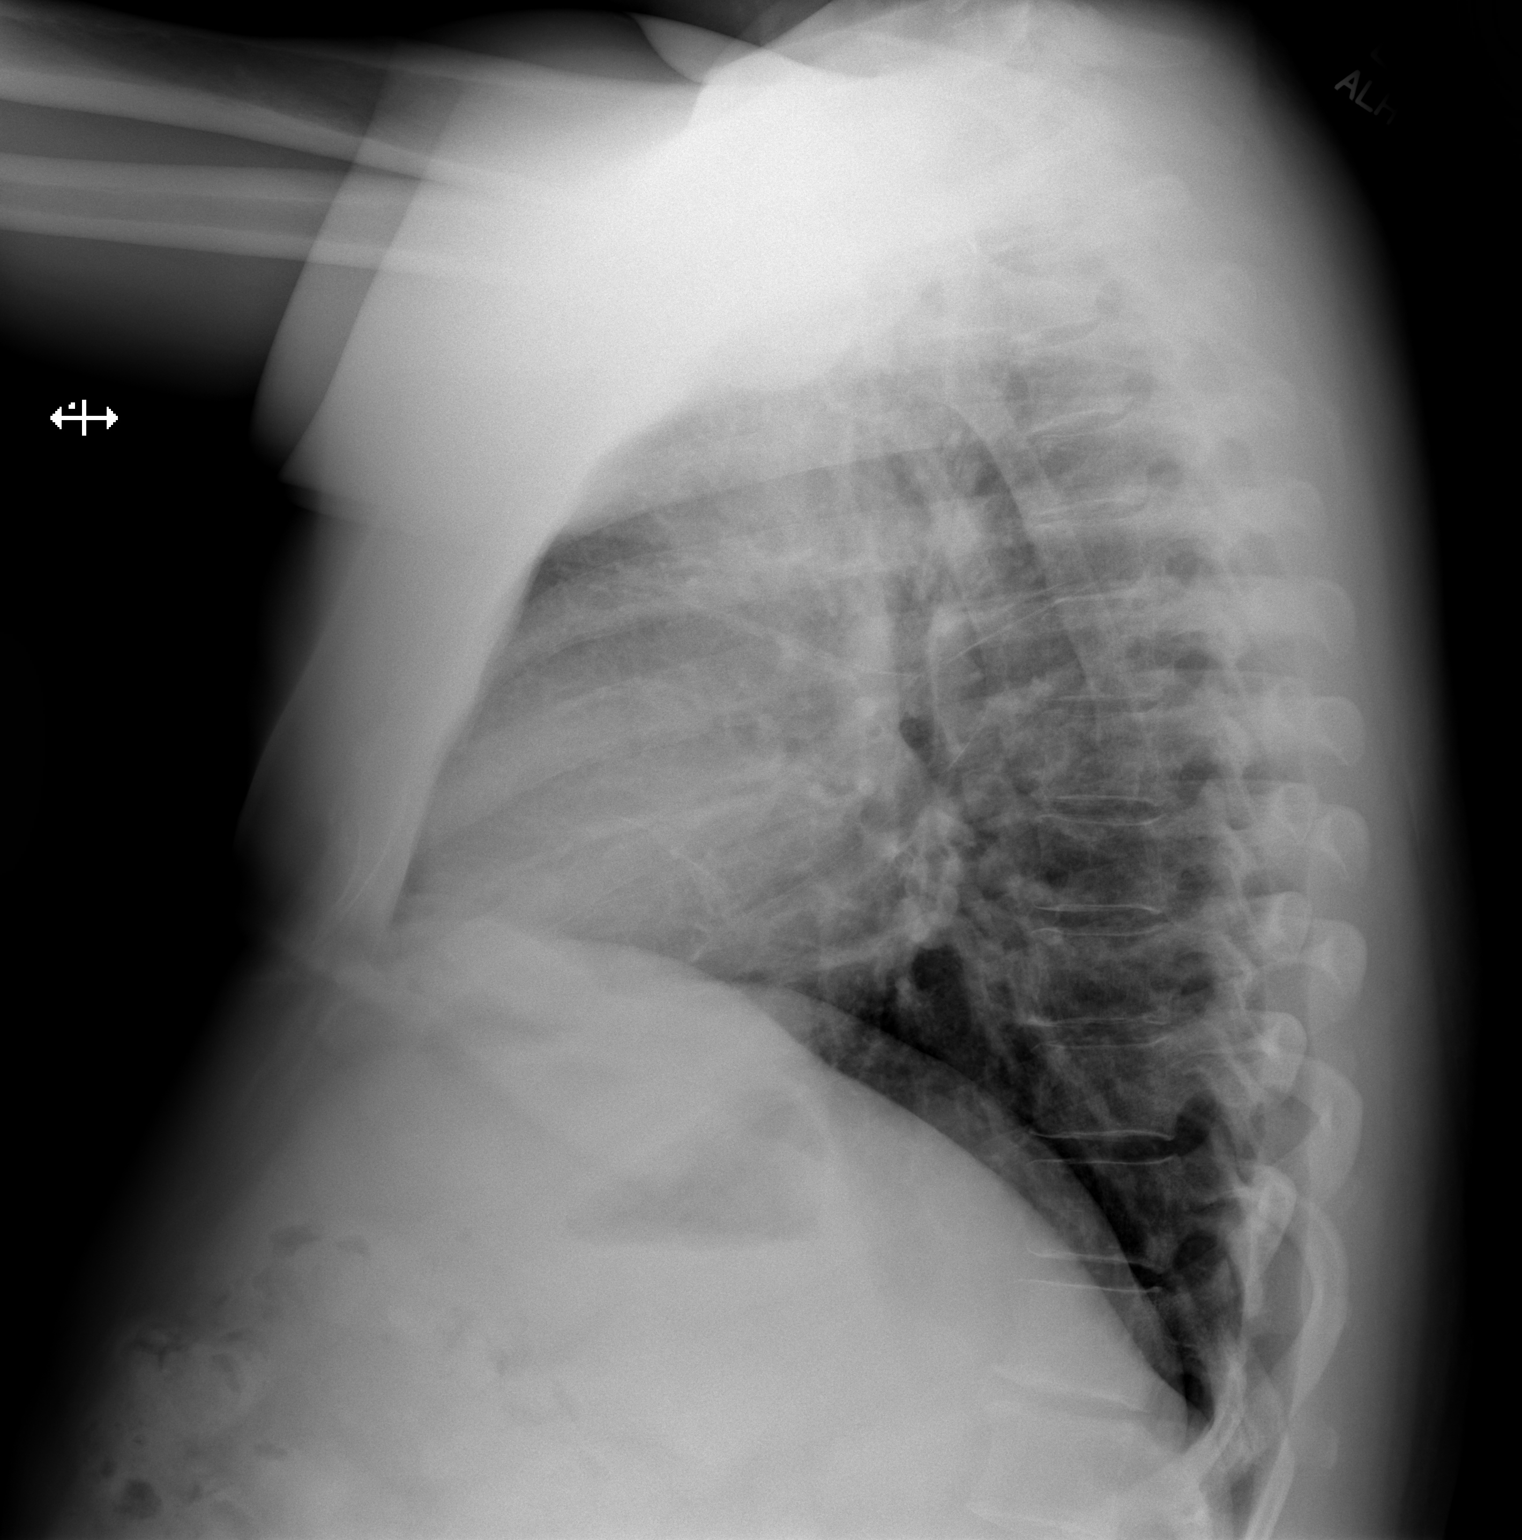

[2 of 2 positions shown; findings below may reference images not displayed]

FINDINGS: The heart size and mediastinal contours are within normal limits.
Both lungs are clear. The visualized skeletal structures are
unremarkable.
IMPRESSION: No active cardiopulmonary disease.

## 2023-05-30 ENCOUNTER — Other Ambulatory Visit (HOSPITAL_BASED_OUTPATIENT_CLINIC_OR_DEPARTMENT_OTHER): Payer: Self-pay | Admitting: Family Medicine

## 2023-05-30 DIAGNOSIS — E785 Hyperlipidemia, unspecified: Secondary | ICD-10-CM

## 2023-06-20 ENCOUNTER — Encounter (HOSPITAL_BASED_OUTPATIENT_CLINIC_OR_DEPARTMENT_OTHER): Payer: Self-pay

## 2023-06-20 ENCOUNTER — Ambulatory Visit (HOSPITAL_BASED_OUTPATIENT_CLINIC_OR_DEPARTMENT_OTHER): Payer: BC Managed Care – PPO
